# Patient Record
Sex: Male | Born: 1970 | Race: Black or African American | Hispanic: No | Marital: Single | Smoking: Never smoker
Health system: Southern US, Community
[De-identification: ages and names within clinical notes are randomized; demographics above are authoritative.]

## PROBLEM LIST (undated history)

## (undated) DIAGNOSIS — L739 Follicular disorder, unspecified: Secondary | ICD-10-CM

## (undated) HISTORY — DX: Follicular disorder, unspecified: L73.9

---

## 2010-06-26 ENCOUNTER — Emergency Department (HOSPITAL_COMMUNITY): Admission: EM | Admit: 2010-06-26 | Discharge: 2010-06-26 | Payer: Self-pay | Admitting: Emergency Medicine

## 2010-07-16 ENCOUNTER — Ambulatory Visit: Payer: Self-pay | Admitting: Family Medicine

## 2010-07-16 ENCOUNTER — Inpatient Hospital Stay (HOSPITAL_COMMUNITY): Admission: EM | Admit: 2010-07-16 | Discharge: 2010-07-19 | Payer: Self-pay | Admitting: Emergency Medicine

## 2010-07-25 ENCOUNTER — Ambulatory Visit: Payer: Self-pay | Admitting: Family Medicine

## 2010-07-25 DIAGNOSIS — R519 Headache, unspecified: Secondary | ICD-10-CM | POA: Insufficient documentation

## 2010-07-25 DIAGNOSIS — R05 Cough: Secondary | ICD-10-CM

## 2010-07-25 DIAGNOSIS — R51 Headache: Secondary | ICD-10-CM | POA: Insufficient documentation

## 2010-08-22 ENCOUNTER — Emergency Department (HOSPITAL_COMMUNITY): Admission: EM | Admit: 2010-08-22 | Discharge: 2010-08-22 | Payer: Self-pay | Admitting: Emergency Medicine

## 2010-09-04 ENCOUNTER — Ambulatory Visit: Payer: Self-pay | Admitting: Family Medicine

## 2010-09-04 LAB — CONVERTED CEMR LAB
Glucose, Urine, Semiquant: NEGATIVE
Protein, U semiquant: NEGATIVE
Specific Gravity, Urine: 1.02
Urobilinogen, UA: 0.2
WBC Urine, dipstick: NEGATIVE
pH: 6

## 2010-09-05 ENCOUNTER — Encounter: Payer: Self-pay | Admitting: Family Medicine

## 2010-09-05 ENCOUNTER — Telehealth: Payer: Self-pay | Admitting: Family Medicine

## 2010-09-24 ENCOUNTER — Emergency Department (HOSPITAL_COMMUNITY)
Admission: EM | Admit: 2010-09-24 | Discharge: 2010-09-24 | Payer: Self-pay | Source: Home / Self Care | Admitting: Emergency Medicine

## 2010-10-08 ENCOUNTER — Ambulatory Visit: Admission: RE | Admit: 2010-10-08 | Discharge: 2010-10-08 | Payer: Self-pay | Source: Home / Self Care

## 2010-10-08 DIAGNOSIS — S139XXA Sprain of joints and ligaments of unspecified parts of neck, initial encounter: Secondary | ICD-10-CM | POA: Insufficient documentation

## 2010-10-23 ENCOUNTER — Emergency Department (HOSPITAL_COMMUNITY)
Admission: EM | Admit: 2010-10-23 | Discharge: 2010-10-23 | Payer: Self-pay | Source: Home / Self Care | Admitting: Emergency Medicine

## 2010-10-30 ENCOUNTER — Emergency Department (HOSPITAL_COMMUNITY)
Admission: EM | Admit: 2010-10-30 | Discharge: 2010-10-30 | Payer: Self-pay | Source: Home / Self Care | Admitting: Emergency Medicine

## 2010-10-31 ENCOUNTER — Ambulatory Visit
Admission: RE | Admit: 2010-10-31 | Discharge: 2010-10-31 | Payer: Self-pay | Source: Home / Self Care | Attending: Family Medicine | Admitting: Family Medicine

## 2010-10-31 ENCOUNTER — Encounter: Payer: Self-pay | Admitting: Family Medicine

## 2010-10-31 DIAGNOSIS — L299 Pruritus, unspecified: Secondary | ICD-10-CM | POA: Insufficient documentation

## 2010-10-31 DIAGNOSIS — R300 Dysuria: Secondary | ICD-10-CM | POA: Insufficient documentation

## 2010-10-31 LAB — CONVERTED CEMR LAB
Albumin: 4.9 g/dL (ref 3.5–5.2)
Bilirubin Urine: NEGATIVE
CO2: 24 meq/L (ref 19–32)
Calcium: 10.1 mg/dL (ref 8.4–10.5)
Glucose, Bld: 90 mg/dL (ref 70–99)
Ketones, urine, test strip: NEGATIVE
Potassium: 3.7 meq/L (ref 3.5–5.3)
Protein, U semiquant: NEGATIVE
RBC: 5.23 M/uL (ref 4.22–5.81)
Sodium: 138 meq/L (ref 135–145)
TSH: 1.257 microintl units/mL (ref 0.350–4.500)
Total Protein: 7.9 g/dL (ref 6.0–8.3)
Urobilinogen, UA: 0.2
WBC: 6.8 10*3/uL (ref 4.0–10.5)

## 2010-11-04 ENCOUNTER — Encounter: Payer: Self-pay | Admitting: Family Medicine

## 2010-11-04 NOTE — Assessment & Plan Note (Signed)
Summary: stomach pain/diarrhea.df   Vital Signs:  Patient profile:   40 year old male Weight:      184 pounds Temp:     98.5 degrees F oral BP sitting:   112 / 82  (left arm) Cuff size:   large  Vitals Entered By: Tessie Fass CMA (September 04, 2010 12:08 PM) CC: abdominal pain x 2 days Pain Assessment Patient in pain? yes     Location: abdomen Intensity: 3   CC:  abdominal pain x 2 days.  History of Present Illness: Pt seen in acute visit; complaint of gassiness in periumbilical area, also some discomfort in abd, x3 weeks. Initially with diarrhea, which has subsided and finally resolved about 4-5 days ago, now with formed stool (nonbloody).   Patient's wife has been treated for positive syphilis, patient is also taking doxycycline from [facility].  Does not associate GI sxs with food or with the doxy.    Has been to [facility] ER for abd sxs, was diagnosed with gastritis and started on Pepcid.  Improved but not resolved.  Still taking the Pepcid.  Complaint of urethral itching, although denies painful void; has not been sexually active with anyone other than his wife.  Denies urethral discharge or change in appearance of urine, no polyuria.   [place], was in refugee camp in [place].  Is a Runner, broadcasting/film/video by trade, working at the [employer] now.  Lives with wife and children.   Allergies (verified): 1)  ! * Quinolones  Physical Exam  General:  well appearingl, no apparent distress Mouth:  clear oropharynx. Lungs:  Normal respiratory effort, chest expands symmetrically. Lungs are clear to auscultation, no crackles or wheezes. Heart:  Normal rate and regular rhythm. S1 and S2 normal without gallop, murmur, click, rub or other extra sounds. Abdomen:  Soft, nontender, nondistended.  No masses.  Normal bowel sounds heard.  Mild discomfort when I palpate suprapubic area.  Genitalia:  No inguinal adenopathy. No urethral meatal redness.  No genital lesions noted  on inspection   Impression & Recommendations:  Problem # 1:  ACUTE GASTRITIS WITHOUT MENTION OF HEMORRHAGE (ICD-535.00) Patient with abd sxs which appear to be improving.  To switch to PPI instead of H1blocker. May consider H pylori testing at next visit.  His updated medication list for this problem includes:    Omeprazole 20 Mg Cpdr (Omeprazole) ..... Sig: take 1 tab by mouth one time daily  Orders: [facility]- Est  Level 4 (86578)  Problem # 2:  DYSURIA (ICD-788.1) UA not remarkable.  Sending GC/Chlamydia PCR to check for STDs, given exposure to syphilis.  Will contact with results when available.  Orders: [facility]- Est  Level 4 (46962) Miscellaneous Lab Charge-[facility] (95284) Urinalysis-[facility] (00000)  His updated medication list for this problem includes:    Doxycycline Hyclate 100 Mg Caps (Doxycycline hyclate) .Marland Kitchen... From [facility]  Complete Medication List: 1)  Omeprazole 20 Mg Cpdr (Omeprazole) .... Sig: take 1 tab by mouth one time daily 2)  Doxycycline Hyclate 100 Mg Caps (Doxycycline hyclate) .... From [facility]  Patient Instructions: 1)  It was a pleasure to see you today.  I have sent a prescription for a new medication for your stomach to [facility] on [address]. [employer]. 2)  You may stop taking the Pepcid you got at the ER at [facility] on November 18th. 3)  I am sending a urinalysis to look for possible causes of your  urethral itching.  4)  I would like you to make a followup appointment here in the coming 2 weeks.  Prescriptions: OMEPRAZOLE 20 MG CPDR (OMEPRAZOLE) SIG: take 1 tab by mouth one time daily  #30 x 2   Entered and Authorized by:   Paula Compton MD   Signed by:   Paula Compton MD on 09/04/2010   Method used:   Electronically to        [facility] [id number] (retail)       [address]       [place], [place]  [address]       Ph: [phone]       Fax: [phone]   RxID:   [id number]    Orders Added: 1)   [facility]- Est  Level 4 [99214] 2)  Miscellaneous Lab Charge-[facility] [88416] 3)  Urinalysis-[facility] [00000]    Laboratory Results   Urine Tests  Date/Time Received: September 04, 2010 12:30 PM  Date/Time Reported: September 04, 2010 1:48 PM   Routine Urinalysis   Color: yellow Appearance: Clear Glucose: negative   (Normal Range: Negative) Bilirubin: negative   (Normal Range: Negative) Ketone: negative   (Normal Range: Negative) Spec. Gravity: 1.020   (Normal Range: 1.003-1.035) Blood: negative   (Normal Range: Negative) pH: 6.0   (Normal Range: 5.0-8.0) Protein: negative   (Normal Range: Negative) Urobilinogen: 0.2   (Normal Range: 0-1) Nitrite: negative   (Normal Range: Negative) Leukocyte Esterace: negative   (Normal Range: Negative)    Comments: ...........test performed by...........Marland KitchenTerese Door, CMA
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
## 2010-11-04 NOTE — Letter (Signed)
Summary: Generic Letter  [facility]  [address]   [place], [place] [address]   Phone: [phone]  Fax: [phone]    09/05/2010  Darrell Donovan [address] [place], [place]  [address]  Dear [name],   I hope this letter finds you well.  I write with good news.  Your urine studies in our office are normal.  Please call back with any questions.       Sincerely,   Paula Compton MD  Appended Document: Generic Letter mailed

## 2010-11-04 NOTE — Progress Notes (Signed)
  Phone Note Outgoing Call Call back at [facility] Phone [phone]   Summary of Call: Called patient to let him know that urine studies are normal.  Left voice message, will send letter.  Initial call taken by: Paula Compton MD,  September 05, 2010 2:08 PM
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
## 2010-11-06 NOTE — Assessment & Plan Note (Signed)
Summary: stomach & back pain/eo   Vital Signs:  Patient profile:   40 year old male Weight:      187.3 pounds Temp:     98.3 degrees F oral Pulse rate:   67 / minute BP sitting:   116 / 86  (left arm) Cuff size:   regular  Vitals Entered By: Garen Grams LPN (October 31, 2010 2:30 PM) CC: burning / itching Is Patient Diabetic? No Pain Assessment Patient in pain? no        Primary Care Provider:  Jamie Brookes MD  CC:  burning / itching.  History of Present Illness: 1. Burning / Itching:  Pt complains of this strange sensation that jumps around.  He has was he describes as a burning / itching sensation that is focal and goes from his arms, to his legs, to his abdomen, and to his groin.  He has had it for a couple of weeks.  Nothing makes it better.  Nothing makes it worse.  He hasn't taken anything for it.  ROS: denies abdominal pain, chest pain, skin discoloration, constipation  2. Dysuria:  Had one episode of dysuria about 1 week ago.  Now the burning has stopped but it still takes him a little longer to get a normal flow of urine.  ROS: denies hematuria, fevers, chills  Habits & Providers  Alcohol-Tobacco-Diet     Tobacco Status: never  Current Medications (verified): 1)  Omeprazole 20 Mg Cpdr (Omeprazole) .... Sig: Take 1 Tab By Mouth One Time Daily 2)  Cyclobenzaprine Hcl 10 Mg Tabs (Cyclobenzaprine Hcl) .... Take 1 Tablet At Night To Help The Muscle Spasms and The Pain 3)  Ibuprofen 800 Mg Tabs (Ibuprofen) .... Take 1 Pill Every 8 Hours As Need For Pain During The Day. 4)  Hydroxyzine Hcl 10 Mg Tabs (Hydroxyzine Hcl) .Marland Kitchen.. 1 Tab By Mouth Daily For Itching  Allergies: 1)  ! * Quinolones  Past History:  Past Medical History: Reviewed history from 07/25/2010 and no changes required. malaria in 1990's, treated  Social History: Reviewed history from 10/08/2010 and no changes required. 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 no smoking, alcohol and no drugs  Physical Exam  General:  Vitals reviewed.  Well appearing.  Comfortable.  No acute distress Head:  normocephalic and atraumatic.   Eyes:  Normal conjunctiva.  No scleral icterus Mouth:  clear oropharynx. Lungs:  Normal respiratory effort, chest expands symmetrically. Lungs are clear to auscultation, no crackles or wheezes. Heart:  Normal rate and regular rhythm. S1 and S2 normal without gallop, murmur, click, rub or other extra sounds. Abdomen:  soft, non-tender, normal bowel sounds, no distention, no masses, no guarding, and no rigidity.   Msk:  no joint tenderness, no joint swelling, and no joint warmth.   Extremities:  No clubbing, cyanosis, edema, or deformity noted with normal full range of motion of all joints.   Neurologic:  Intact Skin:  No skin discoloration.  No rashes or suspicious skin lesions Psych:  not anxious appearing and not depressed appearing.     Impression & Recommendations:  Problem # 1:  PRURITUS (ICD-698.9) Assessment New Not sure what is causing this.  Nonspecific description of burning / itching of various parts of his body that comes and goes and is focal.  Will get some basic labs.  Treat the  itching with Hydroxyzine. Orders: Comp Met-[facility] [id number]) CBC-[facility] (09811) TSH-[facility] [id number]) [facility]- Est  Level 4 (13086)  Problem # 2:  DYSURIA (ICD-788.1) Assessment: New Dysuria has largely resolved but still complaining of some urinary hesistancy.  Will check UA. Orders: Urinalysis-[facility] (00000) [facility]- Est  Level 4 (57846)  Complete Medication List: 1)  Omeprazole 20 Mg Cpdr (Omeprazole) .... Sig: take 1 tab by mouth one time daily 2)  Cyclobenzaprine Hcl 10 Mg Tabs (Cyclobenzaprine hcl) .... Take 1 tablet at night to help the muscle spasms and the pain 3)  Ibuprofen 800 Mg Tabs (Ibuprofen) .... Take 1 pill  every 8 hours as need for pain during the day. 4)  Hydroxyzine Hcl 10 Mg Tabs (Hydroxyzine hcl) .Marland Kitchen.. 1 tab by mouth daily for itching  Patient Instructions: 1)  I am not sure what could be causing your strange burning and itching 2)  I am going to get some labs to try and figure it out 3)  In the meantime, try hydroxyzine, and see if that helps 4)  Please schedule a follow up appointment with your regular doctor in 6-8 weeks Prescriptions: HYDROXYZINE HCL 10 MG TABS (HYDROXYZINE HCL) 1 tab by mouth daily for itching  #30 x 3   Entered and Authorized by:   Angelena Sole MD   Signed by:   Angelena Sole MD on 10/31/2010   Method used:   Electronically to        [facility] [id number] (retail)       [address] [place], [place]  [address]       Ph: [phone]       Fax: [phone]   RxID:   [id number]    Orders Added: 1)  Urinalysis-[facility] [00000] 2)  Comp Met-[facility] [[id number]] 3)  CBC-[facility] [85027] 4)  TSH-[facility] [[id number]] 5)  [facility]- Est  Level 4 [09323]    Laboratory Results   Urine Tests  Date/Time Received: October 31, 2010 2:41 PM  Date/Time Reported: October 31, 2010 3:04 PM   Routine Urinalysis   Color: yellow Appearance: Clear Glucose: negative   (Normal Range: Negative) Bilirubin: negative   (Normal Range: Negative) Ketone: negative   (Normal Range: Negative) Spec. Gravity: <1.005   (Normal Range: 1.003-1.035) Blood: negative   (Normal Range: Negative) pH: 5.5   (Normal Range: 5.0-8.0) Protein: negative   (Normal Range: Negative) Urobilinogen: 0.2   (Normal Range: 0-1) Nitrite: negative   (Normal Range: Negative) Leukocyte Esterace: negative   (Normal Range: Negative)    Comments: .........Marland Kitchenbiochemical negative; microscopic not indicated ...............test performed by......Marland KitchenBonnie A. [place], MLS (ASCP)cm

## 2010-11-10 ENCOUNTER — Encounter: Payer: Self-pay | Admitting: Family Medicine

## 2010-11-12 NOTE — Miscellaneous (Signed)
Summary: ROI  ROI   Imported By: De Nurse 11/04/2010 19:20:51  _____________________________________________________________________  External Attachment:    Type:   Image     Comment:   External Document

## 2010-11-20 NOTE — Miscellaneous (Signed)
Summary: ROI  ROI   Imported By: De Nurse 11/11/2010 11:30:01  _____________________________________________________________________  External Attachment:    Type:   Image     Comment:   External Document

## 2010-11-26 ENCOUNTER — Telehealth: Payer: Self-pay | Admitting: Family Medicine

## 2010-11-26 NOTE — Telephone Encounter (Signed)
Informed of lab results being normal..Darrell Donovan, Darrell Donovan

## 2010-11-26 NOTE — Telephone Encounter (Signed)
Please let this patient know that his labs were all normal from 10-31-10. His hemoglobin, eletrolytes and thyroid were all normal. [place].

## 2010-11-26 NOTE — Telephone Encounter (Signed)
Pt asking for test results from last visit in Jan.

## 2010-11-26 NOTE — Telephone Encounter (Signed)
Pt is requesting results of labs from January.Loralee Pacas [place]

## 2010-12-16 LAB — DIFFERENTIAL
Eosinophils Relative: 2 % (ref 0–5)
Lymphocytes Relative: 39 % (ref 12–46)
Lymphs Abs: 1.9 10*3/uL (ref 0.7–4.0)

## 2010-12-16 LAB — CBC
MCV: 86.3 fL (ref 78.0–100.0)
Platelets: 210 10*3/uL (ref 150–400)
RBC: 4.82 MIL/uL (ref 4.22–5.81)
WBC: 4.9 10*3/uL (ref 4.0–10.5)

## 2010-12-16 LAB — URINALYSIS, ROUTINE W REFLEX MICROSCOPIC
Glucose, UA: NEGATIVE mg/dL
Ketones, ur: NEGATIVE mg/dL
Specific Gravity, Urine: 1.014 (ref 1.005–1.030)
pH: 5.5 (ref 5.0–8.0)

## 2010-12-16 LAB — BASIC METABOLIC PANEL
Chloride: 106 mEq/L (ref 96–112)
Creatinine, Ser: 1.25 mg/dL (ref 0.4–1.5)
GFR calc Af Amer: >60 mL/min (ref >60)
Potassium: 3.9 mEq/L (ref 3.5–5.1)

## 2010-12-17 LAB — EXPECTORATED SPUTUM ASSESSMENT W GRAM STAIN, RFLX TO RESP C

## 2010-12-18 LAB — AFB CULTURE WITH SMEAR (NOT AT ARMC)
Acid Fast Smear: NONE SEEN
Acid Fast Smear: NONE SEEN

## 2010-12-18 LAB — CULTURE, RESPIRATORY W GRAM STAIN: Culture: NORMAL

## 2010-12-18 LAB — DIFFERENTIAL
Basophils Relative: 0 % (ref 0–1)
Basophils Relative: 0 % (ref 0–1)
Eosinophils Absolute: 0.2 10*3/uL (ref 0.0–0.7)
Eosinophils Relative: 1 % (ref 0–5)
Lymphs Abs: 1.8 10*3/uL (ref 0.7–4.0)
Monocytes Absolute: 1.1 10*3/uL — ABNORMAL HIGH (ref 0.1–1.0)
Monocytes Absolute: 0.9 10*3/uL (ref 0.1–1.0)
Monocytes Relative: 16 % — ABNORMAL HIGH (ref 3–12)
Monocytes Relative: 12 % (ref 3–12)
Neutro Abs: 3.7 10*3/uL (ref 1.7–7.7)

## 2010-12-18 LAB — CBC
HCT: 40.3 % (ref 39.0–52.0)
HCT: 43 % (ref 39.0–52.0)
Hemoglobin: 14 g/dL (ref 13.0–17.0)
Hemoglobin: 15 g/dL (ref 13.0–17.0)
MCH: 28.7 pg (ref 26.0–34.0)
MCH: 29.5 pg (ref 26.0–34.0)
MCHC: 34.7 g/dL (ref 30.0–36.0)
MCHC: 34.9 g/dL (ref 30.0–36.0)
MCV: 84.6 fL (ref 78.0–100.0)
RBC: 4.87 MIL/uL (ref 4.22–5.81)

## 2010-12-18 LAB — BASIC METABOLIC PANEL
CO2: 29 mEq/L (ref 19–32)
Chloride: 102 mEq/L (ref 96–112)
Glucose, Bld: 94 mg/dL (ref 70–99)
Potassium: 3.7 mEq/L (ref 3.5–5.1)
Sodium: 138 mEq/L (ref 135–145)

## 2010-12-18 LAB — POCT I-STAT, CHEM 8
BUN: 20 mg/dL (ref 6–23)
Creatinine, Ser: 1.2 mg/dL (ref 0.4–1.5)
Sodium: 139 mEq/L (ref 135–145)
TCO2: 28 mmol/L (ref 0–100)

## 2010-12-18 LAB — QUANTIFERON TB GOLD ASSAY (BLOOD)
Interferon Gamma Release Assay: NEGATIVE
Mitogen Minus Nil Value: 6.94 IU/mL

## 2010-12-18 LAB — EXPECTORATED SPUTUM ASSESSMENT W GRAM STAIN, RFLX TO RESP C

## 2010-12-31 ENCOUNTER — Encounter: Payer: Self-pay | Admitting: Family Medicine

## 2010-12-31 ENCOUNTER — Ambulatory Visit (INDEPENDENT_AMBULATORY_CARE_PROVIDER_SITE_OTHER): Payer: Self-pay | Admitting: Family Medicine

## 2010-12-31 DIAGNOSIS — R197 Diarrhea, unspecified: Secondary | ICD-10-CM | POA: Insufficient documentation

## 2010-12-31 DIAGNOSIS — S139XXA Sprain of joints and ligaments of unspecified parts of neck, initial encounter: Secondary | ICD-10-CM

## 2010-12-31 DIAGNOSIS — R142 Eructation: Secondary | ICD-10-CM

## 2010-12-31 DIAGNOSIS — K5900 Constipation, unspecified: Secondary | ICD-10-CM | POA: Insufficient documentation

## 2010-12-31 DIAGNOSIS — L299 Pruritus, unspecified: Secondary | ICD-10-CM

## 2010-12-31 DIAGNOSIS — R141 Gas pain: Secondary | ICD-10-CM | POA: Insufficient documentation

## 2010-12-31 MED ORDER — CETIRIZINE HCL 10 MG PO TABS: 10.0000 mg | ORAL_TABLET | Freq: Every day | ORAL | Status: DC

## 2010-12-31 MED ORDER — HYDROXYZINE HCL 10 MG PO TABS: 10.0000 mg | ORAL_TABLET | Freq: Every day | ORAL | Status: DC

## 2010-12-31 MED ORDER — IBUPROFEN 800 MG PO TABS: 800.0000 mg | ORAL_TABLET | Freq: Three times a day (TID) | ORAL | Status: DC | PRN

## 2010-12-31 NOTE — Patient Instructions (Addendum)
Go to the pharamacy and get  immudium for diarrhea, Miralax for constipation, and make sure to eat a diet with fruits and vegetables to increase the amount of fiber in your diet.  You can use Gas-X for the pain of "bubbles" in your intestines.  Take the cetirizine for allergies and itching every morning. Only take the hydroxyzine if you have really bad itching after a shower at night because it will make you sleepy.  Take the Motrin 800 mg WITH FOOD, for the pain in your neck and low back.  You can also try massage to those muscles or heat (heating pad or hot towels) over the muscles to help them relax.

## 2010-12-31 NOTE — Assessment & Plan Note (Signed)
Pt has occasional diarrhea, will recommend Immodium OTC.

## 2011-01-01 NOTE — Assessment & Plan Note (Addendum)
Paraspinal muscles are sore. No injury suspected. Suggested Motrin 800 mg every 8 hours and/or heating pad/towels around his neck and massage if possible.
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
## 2011-01-01 NOTE — Assessment & Plan Note (Addendum)
Pt complains of intermittat constipation. Suggested using Miralax for constipation. Can buy OTC

## 2011-01-01 NOTE — Assessment & Plan Note (Signed)
Pt c/o itching that is most intense after getting out of the shower. Plan to start him on Zyrtec and hydroxyzine for the really bad itching.

## 2011-01-01 NOTE — Assessment & Plan Note (Signed)
Pt complains of "bubbles" in his stomach and pain with the air moving through his stomach.  Recommended getting GasX for the pain.

## 2011-06-05 ENCOUNTER — Emergency Department (HOSPITAL_COMMUNITY)
Admission: EM | Admit: 2011-06-05 | Discharge: 2011-06-05 | Discharge status: Against Medical Advice | Payer: Medicaid Other | Attending: Emergency Medicine | Admitting: Emergency Medicine

## 2011-06-05 DIAGNOSIS — Z0389 Encounter for observation for other suspected diseases and conditions ruled out: Secondary | ICD-10-CM | POA: Insufficient documentation

## 2011-07-01 ENCOUNTER — Ambulatory Visit: Payer: Medicaid Other

## 2011-07-02 ENCOUNTER — Ambulatory Visit (INDEPENDENT_AMBULATORY_CARE_PROVIDER_SITE_OTHER): Payer: Medicaid Other | Admitting: Family Medicine

## 2011-07-02 DIAGNOSIS — R300 Dysuria: Secondary | ICD-10-CM

## 2011-07-02 DIAGNOSIS — R109 Unspecified abdominal pain: Secondary | ICD-10-CM | POA: Insufficient documentation

## 2011-07-02 LAB — CBC WITH DIFFERENTIAL/PLATELET
Basophils Relative: 0 % (ref 0–1)
Eosinophils Absolute: 0.1 10*3/uL (ref 0.0–0.7)
MCH: 28.9 pg (ref 26.0–34.0)
MCHC: 33.8 g/dL (ref 30.0–36.0)
Neutrophils Relative %: 50 % (ref 43–77)
Platelets: 238 10*3/uL (ref 150–400)

## 2011-07-02 LAB — POCT URINALYSIS DIPSTICK
Glucose, UA: NEGATIVE
Ketones, UA: NEGATIVE
Spec Grav, UA: 1.025

## 2011-07-02 MED ORDER — CEPHALEXIN 500 MG PO CAPS: 500.0000 mg | ORAL_CAPSULE | Freq: Four times a day (QID) | ORAL | Status: DC

## 2011-07-02 MED ORDER — DOXYCYCLINE HYCLATE 100 MG PO TABS: 100.0000 mg | ORAL_TABLET | Freq: Two times a day (BID) | ORAL | Status: AC

## 2011-07-02 NOTE — Progress Notes (Signed)
  Subjective:    Patient ID: Darrell Donovan, male    DOB: 1970/10/10, 40 y.o.   MRN: [id number]  HPI Dysuria x 1 week. + abdominal pain, increased urinary frequency. No penile discharge however increase penile irritation. Patient states he's in a monogamous relationship with his wife. No other sexual activity.  Patient does state that his wife was diagnosed with syphilis about 6 months ago and treated.   Review of Systems See HPI    Objective:   Physical Exam Gen: up in chair, NAD HEENT: NCAT, EOMI, TMs clear bilaterally CV: RRR, no murmurs auscultated PULM: CTAB, no wheezes, rales, rhoncii ABD: S/+ bowel sounds/+ mild suprapubic tenderness EXT: 2+ peripheral pulses   Assessment & Plan:

## 2011-07-02 NOTE — Patient Instructions (Signed)
It was very good to see you today  I am treating you with antibiotics  We will culture your urine I will call you if any results are positive Follow up with your regular doctor, Call with any questions,  God Bless,  Doree Albee MD

## 2011-07-03 LAB — RPR

## 2011-07-03 LAB — HIV ANTIBODY (ROUTINE TESTING W REFLEX): HIV: NONREACTIVE

## 2011-07-03 LAB — GC/CHLAMYDIA PROBE AMP, URINE: GC Probe Amp, Urine: NEGATIVE

## 2011-07-03 NOTE — Assessment & Plan Note (Signed)
Will symptomatically cover for urethritis with doxycycline. Broad-spectrum cultures obtained including HIV, urine culture,  RPR, urine GC/chlamydia. Red flags for return discussed.

## 2011-07-04 LAB — URINE CULTURE: Colony Count: NO GROWTH

## 2011-07-07 ENCOUNTER — Telehealth: Payer: Self-pay | Admitting: Family Medicine

## 2011-07-07 NOTE — Telephone Encounter (Signed)
Darrell Donovan was prescribed Doxycycline on 9/27 and it was sent to a Pharmacy in [place], but he needs it sent to [facility].  Patient will call back with the name of the Pharmacy he wants the medication to go to.

## 2011-07-07 NOTE — Telephone Encounter (Signed)
Darrell Donovan, I attempted to call this patient and find out what pharmacy he wants to switch to. He speaks broken [place], would you please call him and confirm the pharmacy he wants meds sent to. Also if you would please inform patient that he can callthe pharmacy he is using now and transfer it to new pharmacy by informing them. ----Huntley Dec

## 2011-07-09 NOTE — Telephone Encounter (Signed)
Patient called today and left a message on the Physicians/ Pharmacy line .  Difficult to understand message but it sounds like pharmacy does not have a med that was suppose to be sent in.Tried to call him back. Message left for him to call us back.

## 2011-07-23 ENCOUNTER — Ambulatory Visit (INDEPENDENT_AMBULATORY_CARE_PROVIDER_SITE_OTHER): Payer: Medicaid Other | Admitting: Family Medicine

## 2011-07-23 ENCOUNTER — Ambulatory Visit: Payer: Medicaid Other | Admitting: Family Medicine

## 2011-07-23 VITALS — BP 118/71 | HR 78 | Temp 98.2°F | Ht 68.0 in | Wt 183.0 lb

## 2011-07-23 DIAGNOSIS — R21 Rash and other nonspecific skin eruption: Secondary | ICD-10-CM

## 2011-07-23 DIAGNOSIS — L299 Pruritus, unspecified: Secondary | ICD-10-CM

## 2011-07-23 MED ORDER — HYDROCORTISONE 1 % EX LOTN: TOPICAL_LOTION | Freq: Two times a day (BID) | CUTANEOUS | Status: DC

## 2011-07-23 MED ORDER — HYDROXYZINE HCL 10 MG PO TABS: 10.0000 mg | ORAL_TABLET | Freq: Every day | ORAL | Status: AC

## 2011-07-23 NOTE — Patient Instructions (Signed)
Use the Hydrocortisone cream on your itchy spots for the next 2 weeks.  If the itching or rash get worse, come back and see [place]. You should be rechecked in 2-3 months (December or January) to make sure that your blood tests are still negative. If you have any questions, call and let me know. If the rash has not gone away, come back and see me in about 3-4 weeks.

## 2011-07-23 NOTE — Progress Notes (Signed)
  Subjective:    Patient ID: Darrell Donovan, male    DOB: August 31, 1971, 40 y.o.   MRN: [id number]  HPI 1.  Rash:  Patient complains of bumps and itching for the past 2 weeks. Of note he was recently seen at the end of September as his wife was diagnosed with syphilis. RPR and HIV for him were negative. Has not tried anything for relief of itching. No one else in his family at home has a rash or itching. No fevers or chills. No recent illnesses.   Review of Systems See HPI above for review of systems.       Objective:   Physical Exam Gen:  Alert, cooperative patient who appears stated age in no acute distress.  Vital signs reviewed. Skin:  Few scattered, pinpoint papules along medial aspect of thigh.  No signs of folliculitis.  Few scattered papules across back.  No macular lesions, plaques, other lesions throughout body.  No penile chancre.  Scars noted across back of hands, back, legs -- patient states these are old from when he was in [place].  Lymph:  No lymphadenopathy        Assessment & Plan:

## 2011-07-23 NOTE — Assessment & Plan Note (Signed)
Unclear etiology of rash.  Plan for symptomatic treatment. Refilled hydroxyzine.   All micro labs have been negative thus far. Low dose hydrocortisone for relief.   No signs of systemic illness, no signs of maculopapular rash or evidence of secondary/primary syphilis. Recommended fu in 3 months for repeat RPR/HIV.   FU 2-4 weeks if not improving or worsening.

## 2011-08-19 ENCOUNTER — Ambulatory Visit: Payer: Medicaid Other | Admitting: Family Medicine

## 2011-09-02 ENCOUNTER — Encounter: Payer: Self-pay | Admitting: Family Medicine

## 2011-09-02 ENCOUNTER — Ambulatory Visit (INDEPENDENT_AMBULATORY_CARE_PROVIDER_SITE_OTHER): Payer: Medicaid Other | Admitting: Family Medicine

## 2011-09-02 VITALS — BP 115/84 | HR 67 | Temp 97.3°F | Ht 68.0 in | Wt 191.0 lb

## 2011-09-02 DIAGNOSIS — L299 Pruritus, unspecified: Secondary | ICD-10-CM

## 2011-09-02 MED ORDER — EUCERIN EX CREA: TOPICAL_CREAM | CUTANEOUS | Status: DC | PRN

## 2011-09-02 MED ORDER — CLOTRIMAZOLE-BETAMETHASONE 1-0.05 % EX CREA: TOPICAL_CREAM | CUTANEOUS | Status: DC

## 2011-09-02 NOTE — Patient Instructions (Signed)

## 2011-09-02 NOTE — Progress Notes (Signed)
  Subjective:     Darrell Donovan is a 40 y.o. male who presents for evaluation of a rash involving the lower extremity, thigh and feet. Rash started 1 month ago. Lesions are not visible, and pruritic in texture.Associated symptoms: none. Patient denies: abdominal pain, arthralgia, cough, fever and sore throat. Patient has not had contacts with similar rash. Patient has had new exposures (soaps, lotions, laundry detergents, foods, medications, plants, insects or animals). He has been in sexual contact with his wife who has a history of treated syphilis. He has two RPR's in the last month that are negative.  He also has breakdown of web spaces between toes. Without itching.  Review of Systems Pertinent items are noted in HPI.    Objective:    BP 115/84  Pulse 67  Temp(Src) 97.3 F (36.3 C) (Oral)  Ht 5\' 8"  (1.727 m)  Wt 191 lb (86.637 kg)  BMI 29.04 kg/m2 General:  alert and cooperative  Skin:  no rash or abnormalities    Toes: breakdown and maceration of web spaces between toes Assessment:    Dry Skin and pruritis    Tinea pedis   Plan:    Medications: eucerin cream.  Lotrimin cream for toes.

## 2011-09-02 NOTE — Assessment & Plan Note (Signed)
Secondary to dry skin. Likely from weather change and detergents.  He has recently come to this climate from [place].

## 2011-10-02 ENCOUNTER — Emergency Department (HOSPITAL_COMMUNITY)
Admission: EM | Admit: 2011-10-02 | Discharge: 2011-10-02 | Disposition: A | Discharge status: Home / Self Care | Payer: Medicaid Other | Attending: Emergency Medicine | Admitting: Emergency Medicine

## 2011-10-02 ENCOUNTER — Emergency Department (HOSPITAL_COMMUNITY): Payer: Medicaid Other

## 2011-10-02 ENCOUNTER — Encounter (HOSPITAL_COMMUNITY): Payer: Self-pay

## 2011-10-02 ENCOUNTER — Other Ambulatory Visit: Payer: Self-pay

## 2011-10-02 DIAGNOSIS — M79609 Pain in unspecified limb: Secondary | ICD-10-CM | POA: Insufficient documentation

## 2011-10-02 DIAGNOSIS — R071 Chest pain on breathing: Secondary | ICD-10-CM | POA: Insufficient documentation

## 2011-10-02 DIAGNOSIS — R0789 Other chest pain: Secondary | ICD-10-CM

## 2011-10-02 LAB — TROPONIN I: Troponin I: <0.3 ng/mL (ref <0.30)

## 2011-10-02 MED ORDER — IBUPROFEN 600 MG PO TABS: 600.0000 mg | ORAL_TABLET | Freq: Four times a day (QID) | ORAL | Status: AC | PRN

## 2011-10-02 MED ORDER — HYDROCODONE-ACETAMINOPHEN 5-325 MG PO TABS: 2.0000 | ORAL_TABLET | ORAL | Status: AC | PRN

## 2011-10-02 NOTE — ED Provider Notes (Signed)
History     CSN: [id number]  Arrival date & time 10/02/11  1446   First MD Initiated Contact with Patient 10/02/11 1810      Chief Complaint  Patient presents with  . Chest Pain    (Consider location/radiation/quality/duration/timing/severity/associated sxs/prior treatment) HPI Rt. Side chest pain began under his rt. Arm and radiates into his anterior rt. Chest wall. Pt. Denies any injuries, does have intermittent sob. Denies any n/v History reviewed. No pertinent past medical history.  History reviewed. No pertinent past surgical history.  History reviewed. No pertinent family history.  History  Substance Use Topics  . Smoking status: Never Smoker   . Smokeless tobacco: Not on file  . Alcohol Use: No      Review of Systems  Allergies  Quinolones  Home Medications   Current Outpatient Rx  Name Route Sig Dispense Refill  . HYDROCODONE-ACETAMINOPHEN 5-325 MG PO TABS Oral Take 2 tablets by mouth every 4 (four) hours as needed for pain. 15 tablet 0  . IBUPROFEN 600 MG PO TABS Oral Take 1 tablet (600 mg total) by mouth every 6 (six) hours as needed for pain. 30 tablet 0    BP 107/74  Pulse 67  Temp(Src) 97.7 F (36.5 C) (Oral)  Resp 16  Ht 5\' 8"  (1.727 m)  Wt 180 lb (81.647 kg)  BMI 27.37 kg/m2  SpO2 100%  Physical Exam  Nursing note and vitals reviewed. Constitutional: He is oriented to person, place, and time. He appears well-developed and well-nourished. No distress.  HENT:  Head: Normocephalic and atraumatic.  Eyes: Pupils are equal, round, and reactive to light.  Neck: Normal range of motion.  Cardiovascular: Normal rate and intact distal pulses.   Pulmonary/Chest: No respiratory distress. He exhibits tenderness.         Tenderness to palpation reproduces the patient's pain  Abdominal: Normal appearance. He exhibits no distension.  Musculoskeletal: Normal range of motion.  Neurological: He is alert and oriented to person, place, and time. No  cranial nerve deficit.  Skin: Skin is warm and dry. No rash noted.  Psychiatric: He has a normal mood and affect. His behavior is normal.    ED Course  Procedures (including critical care time)   Labs Reviewed  TROPONIN I   Dg Chest 2 View  10/02/2011  *RADIOLOGY REPORT*  Clinical Data: Right-sided chest pain  CHEST - 2 VIEW  Comparison: 10/30/2010  Findings: Cardiomediastinal silhouette is stable.  No acute infiltrate or pleural effusion.  No pulmonary edema.  Bony thorax is stable.  IMPRESSION: No active disease.  No significant change.  Original Report Authenticated By: Natasha Mead, M.D.     1. Chest wall pain       MDM          Nelia Shi, MD 10/02/11 2043

## 2011-10-02 NOTE — ED Notes (Signed)
Rt. Side chest pain began under his rt. Arm and radiates into his anterior rt. Chest wall.  Pt. Denies any injuries, does have intermittent sob. Denies any n/v

## 2011-10-20 ENCOUNTER — Ambulatory Visit (INDEPENDENT_AMBULATORY_CARE_PROVIDER_SITE_OTHER): Payer: Medicaid Other | Admitting: Family Medicine

## 2011-10-20 ENCOUNTER — Encounter: Payer: Self-pay | Admitting: Family Medicine

## 2011-10-20 VITALS — BP 117/83 | HR 66 | Temp 98.1°F | Ht 68.0 in | Wt 187.0 lb

## 2011-10-20 DIAGNOSIS — R0781 Pleurodynia: Secondary | ICD-10-CM | POA: Insufficient documentation

## 2011-10-20 DIAGNOSIS — L709 Acne, unspecified: Secondary | ICD-10-CM

## 2011-10-20 DIAGNOSIS — R079 Chest pain, unspecified: Secondary | ICD-10-CM

## 2011-10-20 DIAGNOSIS — L708 Other acne: Secondary | ICD-10-CM

## 2011-10-20 MED ORDER — DICLOFENAC SODIUM 1 % TD GEL: 1.0000 "application " | Freq: Four times a day (QID) | TRANSDERMAL | Status: DC

## 2011-10-20 MED ORDER — SALICYLIC ACID 17 % EX SOLN: Freq: Every day | CUTANEOUS | Status: DC

## 2011-10-20 NOTE — Progress Notes (Signed)
  Subjective:   1. Face rash  Darrell Donovan is a 41 y.o. male who was referred to me for evaluation and treatment of acne. Symptoms have been ongoing for about 2 weeks. The acne is primarily located on the forehead. Previous treatment has included nothing. Risk factors include: none. Family history of acne: negative.  2. Rib pain Patient reports several episodes of night time rib pain bilaterally. Without rash. No chest pain. No SOB. No recent URI. No exercise relation. No cough.   Review of Systems Pertinent items are noted in HPI. No fever, chills, night sweats, weight loss.    Objective:    Physical Exam Lesion Types:  closed comedones  Lesion Locations:  forehead  Complications:  none  Other Findings:  none     Assessment:

## 2011-10-20 NOTE — Assessment & Plan Note (Signed)
Topical salicylic acid to face at night.

## 2011-10-20 NOTE — Assessment & Plan Note (Signed)
Diclofenac gel. Topically.

## 2011-10-20 NOTE — Patient Instructions (Signed)
It was great to see you today!  Schedule an appointment to see me as needed.  I have prescribed a liquid that I want you to put on your face at night with cotton balls.

## 2011-10-21 ENCOUNTER — Other Ambulatory Visit: Payer: Self-pay | Admitting: Family Medicine

## 2011-10-21 MED ORDER — SALICYLIC ACID 6 % EX GEL: Freq: Every day | CUTANEOUS | Status: DC

## 2011-10-29 ENCOUNTER — Ambulatory Visit (INDEPENDENT_AMBULATORY_CARE_PROVIDER_SITE_OTHER): Payer: Medicaid Other | Admitting: Family Medicine

## 2011-10-29 VITALS — BP 124/73 | HR 67 | Temp 97.8°F | Ht 68.0 in | Wt 184.7 lb

## 2011-10-29 DIAGNOSIS — R109 Unspecified abdominal pain: Secondary | ICD-10-CM

## 2011-10-29 DIAGNOSIS — N342 Other urethritis: Secondary | ICD-10-CM

## 2011-10-29 DIAGNOSIS — R1030 Lower abdominal pain, unspecified: Secondary | ICD-10-CM

## 2011-10-29 LAB — POCT URINALYSIS DIPSTICK
Bilirubin, UA: NEGATIVE
Blood, UA: NEGATIVE
Nitrite, UA: NEGATIVE
Urobilinogen, UA: 0.2
pH, UA: 6

## 2011-10-29 MED ORDER — SULFAMETHOXAZOLE-TMP DS 800-160 MG PO TABS: 1.0000 | ORAL_TABLET | Freq: Two times a day (BID) | ORAL | Status: AC

## 2011-10-29 NOTE — Progress Notes (Signed)
Subjective: The patient is a 41 y.o. year old male who presents today for urethral itching and testicular pain.  Has been present for 10 days now. No discharge.  Last sex was August 2012.  Nothing seems to make worse or better.  Had ex-wife (now separated) dx with syphilous.  Had initial STD tests negative.  Objective:  Filed Vitals:   10/29/11 1459  BP: 124/73  Pulse: 67  Temp: 97.8 F (36.6 C)   Gen: NAD, appropriate weight CV: RRR Resp: CTABL Genital: Normal inguinal region with no adenopathy or tenderness, no testicular tenderness, slight tenderness of right vas.  No lesions on scrotum or penis.  No urethral discharge.  Urethral swab obtained for gc/chl.  Rectal and prostate exam refused. Ext: No edema  Assessment/Plan: ?prostatitis vs mild epididymitis.  Will presumptively treat with 2 weeks bactrim then d/c.  Repeat HIV/RPR.  Please also see individual problems in problem list for problem-specific plans.

## 2011-10-29 NOTE — Patient Instructions (Signed)
We will get your blood tested to make certain you have not developed an infection. I want you to take the antibiotic I have sent in two times daily for 14 days. Come back at that time if you aren't feeling better.

## 2011-10-30 LAB — HIV ANTIBODY (ROUTINE TESTING W REFLEX): HIV: NONREACTIVE

## 2011-10-30 LAB — RPR

## 2011-11-04 ENCOUNTER — Encounter: Payer: Self-pay | Admitting: Family Medicine
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
## 2011-12-21 ENCOUNTER — Ambulatory Visit: Payer: Medicaid Other | Admitting: Family Medicine

## 2011-12-22 ENCOUNTER — Ambulatory Visit (INDEPENDENT_AMBULATORY_CARE_PROVIDER_SITE_OTHER): Payer: Medicaid Other | Admitting: Family Medicine

## 2011-12-22 VITALS — BP 122/84 | HR 68 | Temp 97.9°F | Ht 68.0 in | Wt 184.0 lb

## 2011-12-22 DIAGNOSIS — L299 Pruritus, unspecified: Secondary | ICD-10-CM

## 2011-12-22 DIAGNOSIS — L309 Dermatitis, unspecified: Secondary | ICD-10-CM

## 2011-12-22 DIAGNOSIS — L259 Unspecified contact dermatitis, unspecified cause: Secondary | ICD-10-CM

## 2011-12-22 MED ORDER — TRIAMCINOLONE ACETONIDE 0.1 % EX CREA: TOPICAL_CREAM | Freq: Two times a day (BID) | CUTANEOUS | Status: DC

## 2011-12-22 NOTE — Progress Notes (Signed)
S: Pt comes in today for SDA for rash on his legs.  First noticed the rash 2-3 months ago. +puritis.  Nothing changed that prompted him to come in today.  Some discomfort.  No fevers or chills.  Occasional headache. No N/V/D. Patient states that the rash is fine bumps.  Has not changed over the past few days.  Worse at night time.  Has not tried anything for it.     ROS: Per HPI  History  Smoking status  . Never Smoker   Smokeless tobacco  . Not on file    O:  Filed Vitals:   12/22/11 0830  BP: 122/84  Pulse: 68  Temp: 97.9 F (36.6 C)    Gen: NAD Skin: small, raised papules over bilateral medial thighs without overlying erythema, no obvious abscess or infection, no drainage.     A/P: 41 y.o. male p/w skin irritation/contact dermatitis -See problem list -f/u in 3-4 weeks with PCP

## 2011-12-22 NOTE — Assessment & Plan Note (Signed)
Patient's main complaint is itching.  Will try steroid to affected area.  Encouraged moisturizer every day to help with dry skin.  F/u with PCP in a few weeks to see if steroid helped.

## 2011-12-22 NOTE — Patient Instructions (Addendum)
It was nice to meet you today. I think that the bumps and itching on your legs are from dry skin and irritation.  I sent in a prescription for a cream for you to put on the areas that have the bumps twice a day, every day, until the bumps clear up.  Then you can use it as needed. You should also get a good moisturizing lotion and use it every day.   Come back and see Dr. Rivka Safer in a few weeks to if the the new medicine helped.

## 2012-01-26 ENCOUNTER — Other Ambulatory Visit (HOSPITAL_COMMUNITY)
Admission: RE | Admit: 2012-01-26 | Discharge: 2012-01-26 | Disposition: A | Discharge status: Home / Self Care | Payer: Medicaid Other | Source: Ambulatory Visit | Attending: Family Medicine | Admitting: Family Medicine

## 2012-01-26 ENCOUNTER — Encounter: Payer: Self-pay | Admitting: Family Medicine

## 2012-01-26 ENCOUNTER — Ambulatory Visit (INDEPENDENT_AMBULATORY_CARE_PROVIDER_SITE_OTHER): Payer: Medicaid Other | Admitting: Family Medicine

## 2012-01-26 VITALS — BP 122/78 | HR 88 | Temp 98.1°F | Ht 68.0 in | Wt 180.3 lb

## 2012-01-26 DIAGNOSIS — Z113 Encounter for screening for infections with a predominantly sexual mode of transmission: Secondary | ICD-10-CM | POA: Insufficient documentation

## 2012-01-26 DIAGNOSIS — M25559 Pain in unspecified hip: Secondary | ICD-10-CM | POA: Insufficient documentation

## 2012-01-26 DIAGNOSIS — L739 Follicular disorder, unspecified: Secondary | ICD-10-CM

## 2012-01-26 DIAGNOSIS — N411 Chronic prostatitis: Secondary | ICD-10-CM | POA: Insufficient documentation

## 2012-01-26 DIAGNOSIS — R300 Dysuria: Secondary | ICD-10-CM

## 2012-01-26 DIAGNOSIS — L738 Other specified follicular disorders: Secondary | ICD-10-CM

## 2012-01-26 DIAGNOSIS — L293 Anogenital pruritus, unspecified: Secondary | ICD-10-CM

## 2012-01-26 HISTORY — DX: Follicular disorder, unspecified: L73.9

## 2012-01-26 LAB — POCT URINALYSIS DIPSTICK
Bilirubin, UA: NEGATIVE
Blood, UA: NEGATIVE
Glucose, UA: NEGATIVE
Leukocytes, UA: NEGATIVE
Nitrite, UA: NEGATIVE

## 2012-01-26 MED ORDER — SULFAMETHOXAZOLE-TMP DS 800-160 MG PO TABS: 1.0000 | ORAL_TABLET | Freq: Two times a day (BID) | ORAL | Status: DC

## 2012-01-26 MED ORDER — METRONIDAZOLE 1 % EX GEL: Freq: Every day | CUTANEOUS | Status: DC

## 2012-01-26 MED ORDER — DICLOFENAC SODIUM 75 MG PO TBEC: 75.0000 mg | DELAYED_RELEASE_TABLET | Freq: Two times a day (BID) | ORAL | Status: DC

## 2012-01-26 NOTE — Patient Instructions (Addendum)
Meds ordered this encounter  Medications  . sulfamethoxazole-trimethoprim (BACTRIM DS) 800-160 MG per tablet    Sig: Take 1 tablet by mouth 2 (two) times daily.    Dispense:  84 tablet    Refill:  0  . metroNIDAZOLE (METROGEL) 1 % gel    Sig: Apply topically daily.    Dispense:  45 g    Refill:  0  . diclofenac (VOLTAREN) 75 MG EC tablet    Sig: Take 1 tablet (75 mg total) by mouth 2 (two) times daily with a meal.    Dispense:  60 tablet    Refill:  2   These are the medications I prescribed. You will take the antibiotic bactrim for (6 weeks) You will use the metrogel on your legs for the rash. Take the diclofenac twice a day to help with your hip pain.   Prostatitis Prostatitis is an inflammation (the body's way of reacting to injury and/or infection) of the prostate gland. The prostate gland is a male organ. The gland is about the size and shape of a walnut. The prostate is located just below the bladder. It produces semen, which is a fluid that helps nourish and transport sperm. Prostatitis is the most common urinary tract problem in men younger than age 20. There are 4 categories of prostatitis:  I - Acute bacterial prostatitis.   II - Chronic bacterial prostatitis.   III - Chronic prostatitis and chronic pelvic pain syndrome (CPPS).   Inflammatory.   Non inflammatory.   IV - Asymptomatic inflammatory prostatitis.  Acute and chronic bacterial prostatitis are problems with bacterial infections of the prostate. "Acute" infection is usually a one-time problem. "Chronic" bacterial prostatitis is a condition with recurrent infection. It is usually caused by the same germ(bacteria). CPPS has symptoms similar to prostate infection. However, no infection is actually found. This condition can cause problems of ongoing pain. Currently, it cannot be cured. Treatments are available and aimed at symptom control.   Asymptomatic inflammatory prostatitis has no symptoms. It is a condition  where infection-fighting cells are found by chance in the urine. The diagnosis is made most often during an exam for other conditions. Other conditions could be infertility or a high level of PSA (prostate-specific antigen) in the blood. SYMPTOMS   Symptoms can vary depending upon the type of prostatitis that exists. There can also be overlap in symptoms. This can make diagnosis difficult. Symptoms: For Acute bacterial prostatitis  Painful urination.   Fever or chills.   Muscle or joint pains.   Low back pain.   Low abdominal pain.   Inability to empty bladder completely.   Sudden urges to urinate.   Frequent urination during the day.   Difficulty starting urine stream.   Need to urinate several times at night (nocturia).   Weak urine stream.   Urethral (tube that carries urine from the bladder out of the body) discharge and dribbling after urination.  For Chronic bacterial prostatitis  Rectal pain.   Pain in the testicles, penis, or tip of the penis.   Pain in the space between the anus and scrotum (perineum).   Low back pain.   Low abdominal pain.   Problems with sexual function.   Painful ejaculation.   Bloody semen.   Inability to empty bladder completely.   Painful urination.   Sudden urges to urinate.   Frequent urination during the day.   Difficulty starting urine stream.   Need to urinate several times at night (nocturia).  Weak urine stream.   Dribbling after urination.   Urethral discharge.  For Chronic prostatitis and chronic pelvic pain syndrome (CPPS) Symptoms are the same as those for chronic bacterial prostatitis. Problems with sexual function are often the reason for seeking care. This important problem should be discussed with your caregiver. For Asymptomatic inflammatory prostatitis As noted above, there are no symptoms with this condition. DIAGNOSIS    Your caregiver may perform a rectal exam. This exam is to determine if the  prostate is swollen and tender.   Sometimes blood work is performed. This is done to see if your white blood cell count is elevated. The Prostate Specific Antigen (PSA) is also measured. PSA is a blood test that can help detect early prostate cancer.   A urinalysis is done to find out what type of infection is present if this is a suspected cause. An additional urinalysis may be done after a digital rectal exam. This is to see if white blood cells are pushed out of the prostate and into the urine. A low-grade infection of the prostate may not be found on the first urinalysis.  In more difficult cases, your caregiver may advise other tests. Tests could include:  Urodynamics -- Tests the function of the bladder and the organs involved in triggering and controlling normal urination.   Urine flow rate.   Cystoscopy -- In this procedure, a thin, telescope-like tube with a light and tiny camera attached (cystoscope) is inserted into the bladder through the urethra. This allows the caregiver to see the inside of the urethra and bladder.   Electromyography -- This procedure tests how the muscles and nerves of the bladder work. It is focused on the muscles that control the anus and pelvic floor. These are the muscles between the anus and scrotum.  In people who show no signs of infection, certain uncommon infections might be causing constant or recurrent symptoms. These uncommon infections are difficult to detect. More work in medicine may help find solutions to these problems. TREATMENT   Antibiotics are used to treat infections caused by germs. If the infection is not treated and becomes long lasting (chronic), it may become a lower grade infection with minor, continual problems. Without treatment, the prostate may develop a boil or furuncle (abscess). This may require surgical treatment. For those with chronic prostatitis and CPPS, it is important to work closely with your primary caregiver and urologist.  For some, the medicines that are used to treat a non-cancerous, enlarged prostate (benign prostatic hypertrophy) may be helpful. Referrals to specialists other than urologists may be necessary. In rare cases when all treatments have been inadequate for pain control, an operation to remove the prostate may be recommended. This is very rare and before this is considered thorough discussion with your urologist is highly recommended.   In cases of secondary to chronic non-bacterial prostatitis, a good relationship with your urologist or primary caregiver is essential because it is often a recurrent prolonged condition that requires a good understanding of the causes and a commitment to therapy aimed at controlling your symptoms. HOME CARE INSTRUCTIONS    Hot sitz baths for 20 minutes, 4 times per day, may help relieve pain.   Non-prescription pain killers may be used as your caregiver recommends if you have no allergies to them. Some illnesses or conditions prevent use of non-prescription drugs. If unsure, check with your caregiver. Take all medications as directed. Take the antibiotics for the prescribed length of time, even if  you are feeling better.  SEEK MEDICAL CARE IF:    You have any worsening of the symptoms that originally brought you to your caregiver.   You have an oral temperature above 102 F (38.9 C).   You experience any side effects from medications prescribed.  SEEK IMMEDIATE MEDICAL CARE IF:    You have an oral temperature above 102 F (38.9 C), not controlled by medicine.   You have pain not relieved with medications.   You develop nausea, vomiting, lightheadedness, or have a fainting episode.   You are unable to urinate.   You pass bloody urine or clots.  Document Released: 09/18/2000 Document Revised: 09/10/2011 Document Reviewed: 08/24/2011 [facility] Patient Information 2012 [place], [place].

## 2012-01-26 NOTE — Assessment & Plan Note (Signed)
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 Will prescribe Diclofenac 75 mg BID.

## 2012-01-26 NOTE — Progress Notes (Signed)
  Subjective:    Patient ID: Darrell Donovan, male    DOB: Dec 31, 1970, 41 y.o.   MRN: [id number]  HPI 1. Urtheritis Patient has dysuria and hip pain. He was tested for GC/CHl/Syph/HIV last visit in January and his U/A was negative. The bactrim resolved his urethritis, but it returned when he stopped taking it. No HIV done today. He has three negative one each year for the last three years. No sig. Weight loss.  No recent viral illness. No eye pain or visual deficits.   2. Pruritis rash on thighs Tried steroid and moisturizers. No rash anywhere else on body or mucous membranes.   3. Hip Pain Chronic. Worse in am. Aching pain bilaterally. No back pain. No shooting pains. Does not exercise. Is not obese.   Tobacco use: Patient is a non smoker.   Review of Systems Pertinent items are noted in HPI.     Objective:   Physical Exam Filed Vitals:   01/26/12 0914  BP: 122/78  Pulse: 88  Temp: 98.1 F (36.7 C)  TempSrc: Oral  Height: 5\' 8"  (1.727 m)  Weight: 180 lb 4.8 oz (81.784 kg)  weight flow sheet reviewed, normal fluctuation in weight since 2011.  Lungs:  Normal respiratory effort, chest expands symmetrically. Lungs are clear to auscultation, no crackles or wheezes. Eye - Pupils Equal Round Reactive to light, Extraocular movements intact, Fundi without hemorrhage or visible lesions, Conjunctiva without redness or discharge Heart - Regular rate and rhythm.  No murmurs, gallops or rubs.    Abdomen: soft and non-tender without masses, organomegaly or hernias noted.  No guarding or rebound Male genitalia: no penile lesions or discharge no testicular masses no bladder distension noted Prostate examination: exam declined by patient Skin:  Intact without suspicious lesions. Small "heat" bumps on inner thighs, no pustules or ulcers/no bullae or vesicles.  Hips: straight leg negative, pain is on lateral aspect not groin. No TTP. FROM passive and active. No clicking or dislocation       Assessment & Plan:

## 2012-01-26 NOTE — Assessment & Plan Note (Signed)
Patient has a persistent follicular rash between his legs on his thighs. We have tried two different steroids and moisturizer. I will try metrogel and treat this like folliculitis - possibly related to heat and rubbing of thighs.

## 2012-01-26 NOTE — Assessment & Plan Note (Signed)
Patient has dysuria and hip pain. He was tested for GC/CHl/Syph/HIV last visit in January and his U/A was negative. The bactrim resolved his urethritis, but it returned when he stopped taking it. 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  No HIV done today. He has three negative one each year for the last three years. No sig. Weight loss.  I checked a U/A, urine culture, Gc/Chl.

## 2012-01-28 LAB — URINE CULTURE: Organism ID, Bacteria: NO GROWTH

## 2012-03-02 ENCOUNTER — Encounter: Payer: Self-pay | Admitting: Family Medicine

## 2012-03-02 ENCOUNTER — Ambulatory Visit (INDEPENDENT_AMBULATORY_CARE_PROVIDER_SITE_OTHER): Payer: Medicaid Other | Admitting: Family Medicine

## 2012-03-02 VITALS — BP 118/76 | HR 59 | Temp 98.5°F | Ht 68.0 in | Wt 177.6 lb

## 2012-03-02 DIAGNOSIS — B353 Tinea pedis: Secondary | ICD-10-CM

## 2012-03-02 DIAGNOSIS — L299 Pruritus, unspecified: Secondary | ICD-10-CM

## 2012-03-02 MED ORDER — TRIAMCINOLONE ACETONIDE 0.1 % EX CREA: TOPICAL_CREAM | Freq: Two times a day (BID) | CUTANEOUS | Status: DC

## 2012-03-02 MED ORDER — TERBINAFINE HCL 1 % EX CREA: TOPICAL_CREAM | Freq: Two times a day (BID) | CUTANEOUS | Status: DC

## 2012-03-02 NOTE — Assessment & Plan Note (Signed)
Both feet between multiple toes.  1 month history.

## 2012-03-02 NOTE — Patient Instructions (Signed)
It was great to see you today!  Schedule an appointment to see me as needed.  I have prescribed you two creams for your feet and for your legs.

## 2012-03-02 NOTE — Progress Notes (Signed)
  Subjective:   Patient ID: Darrell Donovan, male DOB: [birth year] 41 y.o. MRN: [id number] HPI:  1. Chronic rash of inner thighs with pruritis Synopsis: patient has been seen by me and other providers for his inner thigh rash multiple times. He has a very faint red papular rash on the inner thighs that was treated with steroids, antibiotics. It appears almost as a heat rash. RPR/HIV negative.  Location: Inner thighs Onset: has been chronic and recurrent  Time period of: months Severity is described as mild.  Aggravating: unknown Alleviating: kenalog cream apparently is helping.  Associated sx/sn: no fever, no joint pain. He has 3 lbs weight loss that is unintentional (but his graph since 2011 is stable)  2. Tinea Pedis Synopsis: patient has 1 month history of pruritis and scaling red rash between multiple toes.  Location: web space of multiple toes bilaterally.  Onset: has been acute  Time period of: 1 month(s).  Severity is described as mild-moderate.  Aggravating: long periods of wearing the same socks and shoes Alleviating: taking shoes and socks off, cleaning toes well in shower.  Associated sx/sn: itching of the feet.   History  Substance Use Topics  . Smoking status: Never Smoker   . Smokeless tobacco: Not on file  . Alcohol Use: No    Review of Systems: Pertinent items are noted in HPI.  Labs Reviewed: yes, HIV RPR neg.  Reviewed Chart Review for last notes.     Objective:   Filed Vitals:   03/02/12 0935  BP: 118/76  Pulse: 59  Temp: 98.5 F (36.9 C)  TempSrc: Oral  Height: 5\' 8"  (1.727 m)  Weight: 177 lb 9.6 oz (80.559 kg)   Physical Exam: General: aam, nad, pleasant Thighs: normal appearing with very fine red papular rash. No dry skin. No excoriations.  Feet: scaling red rash between multiple toes.  Assessment & Plan:

## 2012-03-02 NOTE — Assessment & Plan Note (Signed)
On his thighs. Improved with kenalog cream.

## 2012-04-12 ENCOUNTER — Ambulatory Visit: Payer: Medicaid Other | Admitting: Family Medicine

## 2012-04-15 ENCOUNTER — Ambulatory Visit (INDEPENDENT_AMBULATORY_CARE_PROVIDER_SITE_OTHER): Payer: Medicaid Other | Admitting: Family Medicine

## 2012-04-15 ENCOUNTER — Other Ambulatory Visit (HOSPITAL_COMMUNITY)
Admission: RE | Admit: 2012-04-15 | Discharge: 2012-04-15 | Disposition: A | Discharge status: Home / Self Care | Payer: Medicaid Other | Source: Ambulatory Visit | Attending: Family Medicine | Admitting: Family Medicine

## 2012-04-15 ENCOUNTER — Encounter: Payer: Self-pay | Admitting: Family Medicine

## 2012-04-15 VITALS — BP 119/75 | HR 64 | Temp 98.5°F | Ht 68.0 in | Wt 169.9 lb

## 2012-04-15 DIAGNOSIS — Z202 Contact with and (suspected) exposure to infections with a predominantly sexual mode of transmission: Secondary | ICD-10-CM

## 2012-04-15 DIAGNOSIS — L853 Xerosis cutis: Secondary | ICD-10-CM | POA: Insufficient documentation

## 2012-04-15 DIAGNOSIS — R238 Other skin changes: Secondary | ICD-10-CM

## 2012-04-15 DIAGNOSIS — Z113 Encounter for screening for infections with a predominantly sexual mode of transmission: Secondary | ICD-10-CM | POA: Insufficient documentation

## 2012-04-15 DIAGNOSIS — Z2089 Contact with and (suspected) exposure to other communicable diseases: Secondary | ICD-10-CM

## 2012-04-15 NOTE — Patient Instructions (Addendum)
Eucerin or Aquafor cream Apply to arms and legs 2 x per day.  Return in 1 month for recheck.

## 2012-04-15 NOTE — Assessment & Plan Note (Signed)
Reassured pt that i feel that dry skin, even though it is bothersome and itchy, can be improved with aggressive moisturization.  Recommended aqua for or Eucerin bid. Reassured pt that std screening was negative in Jan 2013.   Agreed to retest for STD's again at today's appointment to reassure patient.

## 2012-04-15 NOTE — Progress Notes (Signed)
  Subjective:    Patient ID: Darrell Donovan, male    DOB: May 07, 1971, 41 y.o.   MRN: [id number]  HPI Rash: Patient reports rash times months. Improved when he was seen in May and given steroid cream. Also treated for athlete's foot that time and this has improved. But continues to have itching and dryness of arms and legs. No longer has any of the cream that was prescribed. Patient states he feels this rash is due to a sexually transmitted infection. Patient states he no longer has a sexual partner but during the past 6 months he did have relations unprotected with a patient who thinks may have had syphilis.  Had STD screening that was done in January. These were all normal. Negative for syphilis. Like be rechecked at this time. No drainage. No redness of skin. No swelling in skin. No fever.  Smoking status reviewed.   Review of Systems As per above.    Objective:   Physical Exam  Constitutional: He appears well-developed and well-nourished.  Cardiovascular: Normal rate, regular rhythm and normal heart sounds.   No murmur heard. Pulmonary/Chest: Effort normal. No respiratory distress. He has no wheezes.  Neurological: He is alert.  Skin: Skin is warm and dry.       Dry skin present on arms and legs.  No rash present.  No rash on palms or soles of feet.  No lesions on torso or back.           Assessment & Plan:

## 2012-04-16 LAB — RPR

## 2012-04-18 ENCOUNTER — Encounter: Payer: Self-pay | Admitting: Family Medicine

## 2012-05-12 ENCOUNTER — Ambulatory Visit (INDEPENDENT_AMBULATORY_CARE_PROVIDER_SITE_OTHER): Payer: Self-pay | Admitting: Family Medicine

## 2012-05-12 ENCOUNTER — Encounter: Payer: Self-pay | Admitting: Family Medicine

## 2012-05-12 VITALS — BP 110/70 | HR 60 | Temp 99.1°F | Ht 68.0 in | Wt 166.6 lb

## 2012-05-12 DIAGNOSIS — L309 Dermatitis, unspecified: Secondary | ICD-10-CM | POA: Insufficient documentation

## 2012-05-12 DIAGNOSIS — L259 Unspecified contact dermatitis, unspecified cause: Secondary | ICD-10-CM

## 2012-05-12 MED ORDER — TRIAMCINOLONE ACETONIDE 0.1 % EX CREA: TOPICAL_CREAM | Freq: Two times a day (BID) | CUTANEOUS | Status: DC

## 2012-05-12 NOTE — Assessment & Plan Note (Signed)
Will refill triamcinolone.  Reassured not a sign of STD.  Has not had sex since last check 3 weeks ago.  Discussed signs of STD for return.  Declined to fill oral antibiotic today.

## 2012-05-12 NOTE — Patient Instructions (Addendum)
No signs of infection   Will refill cream for rash  Your tests for HIV, syphilis, gonorrhea, chlamydia were normal 3 weeks ago

## 2012-05-12 NOTE — Progress Notes (Signed)
  Subjective:    Patient ID: Darrell Donovan, male    DOB: July 08, 1971, 41 y.o.   MRN: [id number]  HPI Rash in thighs  Today requests refill of cream and pill for infection.  States has a thigh infection.  Notes an intermittent itchy rash on thighs for months. States resolves with using triamcinolone cream for 1-2 weeks.  No fevers, chills, no new soaps detregents, not heat induced.  Patient seen 9 times in the past year for combination of rash, STD testing, penile complaints.   After discussing exam and refilling triamcinolone, he requests more "pills" because he feels he has an infection.  He states his wife had an STD and he would ilke treatment.  He states he has not been sexually active because they are separated.  Has HIV, RPR, GC/Chlamydia normal 3 weeks ago. Denies penile lesions, penile discharge.  Review of Systems See HPI    Objective:   Physical Exam GEN: NAD Ski:  Dry skin.  Examined inner thighs.  A few small skin colored papules appearance of normal follicles.  No evidence of infection such as mollsucm, warts, folliclulitis.       Assessment & Plan:

## 2012-06-03 ENCOUNTER — Ambulatory Visit (INDEPENDENT_AMBULATORY_CARE_PROVIDER_SITE_OTHER): Payer: Self-pay | Admitting: Family Medicine

## 2012-06-03 ENCOUNTER — Encounter: Payer: Self-pay | Admitting: Family Medicine

## 2012-06-03 VITALS — BP 102/72 | Temp 98.4°F | Ht 68.0 in | Wt 166.0 lb

## 2012-06-03 DIAGNOSIS — L259 Unspecified contact dermatitis, unspecified cause: Secondary | ICD-10-CM

## 2012-06-03 DIAGNOSIS — R634 Abnormal weight loss: Secondary | ICD-10-CM | POA: Insufficient documentation

## 2012-06-03 DIAGNOSIS — L309 Dermatitis, unspecified: Secondary | ICD-10-CM

## 2012-06-03 LAB — CBC WITH DIFFERENTIAL/PLATELET
Basophils Relative: 0 % (ref 0–1)
Eosinophils Absolute: 0 10*3/uL (ref 0.0–0.7)
HCT: 42.2 % (ref 39.0–52.0)
Hemoglobin: 14.2 g/dL (ref 13.0–17.0)
MCH: 28.9 pg (ref 26.0–34.0)
MCHC: 33.6 g/dL (ref 30.0–36.0)
MCV: 85.8 fL (ref 78.0–100.0)
Monocytes Absolute: 0.6 10*3/uL (ref 0.1–1.0)
Monocytes Relative: 10 % (ref 3–12)
Neutro Abs: 3.1 10*3/uL (ref 1.7–7.7)

## 2012-06-03 NOTE — Assessment & Plan Note (Addendum)
Advised him to start cetirizine, empiric treatment.  Will check LFT's, CR, bilirubin

## 2012-06-03 NOTE — Patient Instructions (Addendum)
Start taking Men's One a day vitamin  Take cetirizine- allergy medication 10 mg one tablet daily  Make follow-up appointment for one week to discuss lab results

## 2012-06-03 NOTE — Progress Notes (Signed)
  Subjective:    Patient ID: Darrell Donovan, male    DOB: Jan 22, 1971, 41 y.o.   MRN: [id number]  HPI  Here to discuss body itching,intermitten urethral itching.  Has had multiple.  visits in the past year for the same with negative HIV, RPR, CG.CHlamydia.  Also noted weight loss.  This is supported by EMR, 20 pund weight loss in the past 6-9 months. Wt Readings from Last 3 Encounters:  06/03/12 166 lb (75.297 kg)  05/12/12 166 lb 9.6 oz (75.569 kg)  04/15/12 169 lb 14.4 oz (77.066 kg)   Describes intermittent urethral itching.  Rash on legs and skin which was not present on exam at last visit.  States he would like treatment for an infection.  He denies penile discharge, urinary frequency or urgency, and test have been negative.  He cannot further explain what kind of infection he is concerned he has.  He does not recall being on oral antihistamines (w as prescribed hydroxy cine in the past).  States past treatment of antibiotics have helped intermittently but returned.  Reports feeling tired when awakening, anhedonia.  Denies SI, Sadness.  States only anxiety is concern for illness.   I have reviewed patient's  PMH, FH, and Social history and Medications as related to this visit.  Review of Systemsno fver, chills, night sweats, cough,     Objective:   Physical Exam  GEN: Alert & Oriented, No acute distress CV:  Regular Rate & Rhythm, no murmur Respiratory:  Normal work of breathing, CTAB Abd:  + BS, soft, no tenderness to palpation Ext: no pre-tibial edema Psych: flat affect, poor eye contact.  Slow speech.  Depressed appearing     Assessment & Plan:

## 2012-06-03 NOTE — Assessment & Plan Note (Signed)
Nonspecific symtpoms with unintentional weight loss.  Will check cbd c diff, hiv, cmet, tsh, esr, and hepatitis b and c.  Suspect component of anxiety/depression may also be playing a role- he denis. Will have him return in 1 week to discuss labwork.   Advised a daily multivitamin

## 2012-06-04 LAB — HEPATITIS C ANTIBODY, REFLEX: HCV Ab: NEGATIVE

## 2012-06-04 LAB — HIV ANTIBODY (ROUTINE TESTING W REFLEX): HIV: NONREACTIVE

## 2012-06-04 LAB — TSH: TSH: 1.207 u[IU]/mL (ref 0.350–4.500)

## 2012-06-07 ENCOUNTER — Encounter: Payer: Self-pay | Admitting: Family Medicine

## 2012-08-22 ENCOUNTER — Emergency Department (HOSPITAL_COMMUNITY): Payer: No Typology Code available for payment source

## 2012-08-22 ENCOUNTER — Encounter (HOSPITAL_COMMUNITY): Payer: Self-pay | Admitting: Emergency Medicine

## 2012-08-22 ENCOUNTER — Emergency Department (HOSPITAL_COMMUNITY)
Admission: EM | Admit: 2012-08-22 | Discharge: 2012-08-22 | Disposition: A | Discharge status: Home / Self Care | Payer: No Typology Code available for payment source | Attending: Emergency Medicine | Admitting: Emergency Medicine

## 2012-08-22 DIAGNOSIS — Z872 Personal history of diseases of the skin and subcutaneous tissue: Secondary | ICD-10-CM | POA: Insufficient documentation

## 2012-08-22 DIAGNOSIS — S3981XA Other specified injuries of abdomen, initial encounter: Secondary | ICD-10-CM | POA: Insufficient documentation

## 2012-08-22 DIAGNOSIS — IMO0002 Reserved for concepts with insufficient information to code with codable children: Secondary | ICD-10-CM | POA: Insufficient documentation

## 2012-08-22 DIAGNOSIS — S298XXA Other specified injuries of thorax, initial encounter: Secondary | ICD-10-CM | POA: Insufficient documentation

## 2012-08-22 DIAGNOSIS — Y939 Activity, unspecified: Secondary | ICD-10-CM | POA: Insufficient documentation

## 2012-08-22 DIAGNOSIS — T148XXA Other injury of unspecified body region, initial encounter: Secondary | ICD-10-CM

## 2012-08-22 DIAGNOSIS — Y929 Unspecified place or not applicable: Secondary | ICD-10-CM | POA: Insufficient documentation

## 2012-08-22 MED ORDER — HYDROCODONE-ACETAMINOPHEN 5-500 MG PO TABS: 1.0000 | ORAL_TABLET | Freq: Four times a day (QID) | ORAL | Status: AC | PRN

## 2012-08-22 MED ORDER — CYCLOBENZAPRINE HCL 10 MG PO TABS: 10.0000 mg | ORAL_TABLET | Freq: Two times a day (BID) | ORAL | Status: AC | PRN

## 2012-08-22 MED ORDER — ONDANSETRON HCL 4 MG/2ML IJ SOLN
4.0000 mg | Freq: Once | INTRAMUSCULAR | Status: AC
  Administered 2012-08-22: 4 mg via INTRAVENOUS
  Filled 2012-08-22: qty 2

## 2012-08-22 MED ORDER — FENTANYL CITRATE 0.05 MG/ML IJ SOLN
50.0000 ug | Freq: Once | INTRAMUSCULAR | Status: AC
  Administered 2012-08-22: 50 ug via INTRAVENOUS
  Filled 2012-08-22: qty 2

## 2012-08-22 MED ORDER — IOHEXOL 300 MG/ML  SOLN
100.0000 mL | Freq: Once | INTRAMUSCULAR | Status: AC | PRN
  Administered 2012-08-22: 100 mL via INTRAVENOUS
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
## 2012-08-22 NOTE — ED Notes (Signed)
Patient transported to CT 
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
## 2013-03-03 ENCOUNTER — Other Ambulatory Visit (HOSPITAL_COMMUNITY)
Admission: RE | Admit: 2013-03-03 | Discharge: 2013-03-03 | Disposition: A | Discharge status: Home / Self Care | Payer: No Typology Code available for payment source | Source: Ambulatory Visit | Attending: Family Medicine | Admitting: Family Medicine

## 2013-03-03 ENCOUNTER — Ambulatory Visit (INDEPENDENT_AMBULATORY_CARE_PROVIDER_SITE_OTHER): Payer: No Typology Code available for payment source | Admitting: Family Medicine

## 2013-03-03 ENCOUNTER — Encounter: Payer: Self-pay | Admitting: Family Medicine

## 2013-03-03 VITALS — BP 102/65 | HR 67 | Temp 98.8°F | Ht 68.0 in | Wt 192.0 lb

## 2013-03-03 DIAGNOSIS — Z202 Contact with and (suspected) exposure to infections with a predominantly sexual mode of transmission: Secondary | ICD-10-CM | POA: Insufficient documentation

## 2013-03-03 DIAGNOSIS — Z113 Encounter for screening for infections with a predominantly sexual mode of transmission: Secondary | ICD-10-CM | POA: Insufficient documentation

## 2013-03-03 DIAGNOSIS — Z7251 High risk heterosexual behavior: Secondary | ICD-10-CM

## 2013-03-03 DIAGNOSIS — R399 Unspecified symptoms and signs involving the genitourinary system: Secondary | ICD-10-CM

## 2013-03-03 DIAGNOSIS — Z2089 Contact with and (suspected) exposure to other communicable diseases: Secondary | ICD-10-CM

## 2013-03-03 DIAGNOSIS — L309 Dermatitis, unspecified: Secondary | ICD-10-CM

## 2013-03-03 DIAGNOSIS — L259 Unspecified contact dermatitis, unspecified cause: Secondary | ICD-10-CM

## 2013-03-03 DIAGNOSIS — Z20828 Contact with and (suspected) exposure to other viral communicable diseases: Secondary | ICD-10-CM

## 2013-03-03 DIAGNOSIS — R3989 Other symptoms and signs involving the genitourinary system: Secondary | ICD-10-CM

## 2013-03-03 LAB — COMPREHENSIVE METABOLIC PANEL
ALT: 17 U/L (ref 0–53)
AST: 20 U/L (ref 0–37)
CO2: 27 mEq/L (ref 19–32)
Calcium: 9.6 mg/dL (ref 8.4–10.5)
Chloride: 105 mEq/L (ref 96–112)
Creat: 1.27 mg/dL (ref 0.50–1.35)
Sodium: 141 mEq/L (ref 135–145)
Total Protein: 6.8 g/dL (ref 6.0–8.3)

## 2013-03-03 LAB — POCT URINALYSIS DIPSTICK
Bilirubin, UA: NEGATIVE
Glucose, UA: NEGATIVE
Ketones, UA: NEGATIVE
Leukocytes, UA: NEGATIVE
Nitrite, UA: NEGATIVE

## 2013-03-03 LAB — CBC
MCV: 85.1 fL (ref 78.0–100.0)
Platelets: 252 10*3/uL (ref 150–400)
RBC: 4.76 MIL/uL (ref 4.22–5.81)
RDW: 14.3 % (ref 11.5–15.5)
WBC: 5.8 10*3/uL (ref 4.0–10.5)

## 2013-03-03 LAB — VITAMIN B12: Vitamin B-12: 458 pg/mL (ref 211–911)

## 2013-03-03 MED ORDER — PHENAZOPYRIDINE HCL 100 MG PO TABS: 100.0000 mg | ORAL_TABLET | Freq: Three times a day (TID) | ORAL | Status: AC | PRN

## 2013-03-03 NOTE — Patient Instructions (Addendum)
I will let you know in the next 24-48 hours if you have an infection and will need antibiotics Please start using hydrocortisone cream on your skin in the areas where it itches.  Have a great weekend.

## 2013-03-03 NOTE — Progress Notes (Signed)
Darrell Donovan is a 42 y.o. male who presents to [facility] today for burning w/ urination  Urethral itching: started 2 wks ago. No alleviateing factors. Itching sensation piror to and after urination. Comes and goes. Denies any penile discharge. Last intercourse 3 wks ago w/o condom for protection. Partner w/ h/o STD. Denies fevers n/v/d/c, sweats.   Foot pain: started 2 wks ago. Denies trauma. Sensation of something moving in foot 3-4 times daily. Comes on after intercourse. Had last year and resolved after ABX. 2012 was first time pt had this problem. Associated w/ "blood" spots on sole of foot. Pt from [place], and last time there in 1996.   The following portions of the patient's history were reviewed and updated as appropriate: allergies, current medications, past medical history, family and social history, and problem list.  Patient is a nonsmoker.   Past Medical History  Diagnosis Date  . Folliculitis 01/26/2012    ROS as above otherwise neg.    Medications reviewed. Current Outpatient Prescriptions  Medication Sig Dispense Refill  . cyclobenzaprine (FLEXERIL) 10 MG tablet Take 1 tablet (10 mg total) by mouth 2 (two) times daily as needed for muscle spasms.  20 tablet  0  . HYDROcodone-acetaminophen (VICODIN) 5-500 MG per tablet Take 1-2 tablets by mouth every 6 (six) hours as needed for pain.  15 tablet  0  . phenazopyridine (PYRIDIUM) 100 MG tablet Take 1 tablet (100 mg total) by mouth 3 (three) times daily as needed for pain.  30 tablet  0   No current facility-administered medications for this visit.    Exam:  BP 102/65  Pulse 67  Temp(Src) 98.8 F (37.1 C) (Oral)  Ht 5\' 8"  (1.727 m)  Wt 192 lb (87.091 kg)  BMI 29.2 kg/m2 Gen: Well NAD HEENT: EOMI,  MMM SKin: multiple hyperpigmented 1cm spots on foot that are non-blanching. No trauma present or erythema/induration. No obvious rash  Results for orders placed in visit on 03/03/13 (from the past 72 hour(s))  POCT URINALYSIS  DIPSTICK     Status: None   Collection Time    03/03/13  3:24 PM      Result Value Range   Color, UA yellow     Clarity, UA clear     Glucose, UA negative     Bilirubin, UA negative     Ketones, UA negative     Spec Grav, UA >=1.030     Blood, UA negative     pH, UA 6.0     Protein, UA negative     Urobilinogen, UA 0.2     Nitrite, UA negative     Leukocytes, UA Negative    HIV ANTIBODY (ROUTINE TESTING)     Status: None   Collection Time    03/03/13  4:12 PM      Result Value Range   HIV NON REACTIVE  NON REACTIVE  RPR     Status: None   Collection Time    03/03/13  4:12 PM      Result Value Range   RPR NON REAC  NON REAC  CBC     Status: None   Collection Time    03/03/13  4:12 PM      Result Value Range   WBC 5.8  4.0 - 10.5 K/uL   RBC 4.76  4.22 - 5.81 MIL/uL   Hemoglobin 14.0  13.0 - 17.0 g/dL   HCT 96.0  45.4 - 09.8 %   MCV 85.1  78.0 -  100.0 fL   MCH 29.4  26.0 - 34.0 pg   MCHC 34.6  30.0 - 36.0 g/dL   RDW 16.1  09.6 - 04.5 %   Platelets 252  150 - 400 K/uL  COMPREHENSIVE METABOLIC PANEL     Status: None   Collection Time    03/03/13  4:12 PM      Result Value Range   Sodium 141  135 - 145 mEq/L   Potassium 4.0  3.5 - 5.3 mEq/L   Chloride 105  96 - 112 mEq/L   CO2 27  19 - 32 mEq/L   Glucose, Bld 91  70 - 99 mg/dL   BUN 21  6 - 23 mg/dL   Creat 4.09  8.11 - 9.14 mg/dL   Total Bilirubin 0.3  0.3 - 1.2 mg/dL   Alkaline Phosphatase 64  39 - 117 U/L   AST 20  0 - 37 U/L   ALT 17  0 - 53 U/L   Total Protein 6.8  6.0 - 8.3 g/dL   Albumin 4.2  3.5 - 5.2 g/dL   Calcium 9.6  8.4 - 78.2 mg/dL  VITAMIN N56     Status: None   Collection Time    03/03/13  4:12 PM      Result Value Range   Vitamin B-12 458  211 - 911 pg/mL  FOLATE     Status: None   Collection Time    03/03/13  4:12 PM      Result Value Range   Folate >20.0     Comment:       Reference Ranges             Deficient:       0.4 - 3.3 ng/mL             Indeterminate:   3.4 - 5.4 ng/mL              Normal:              > 5.4 ng/mL        TSH     Status: None   Collection Time    03/03/13  4:12 PM      Result Value Range   TSH 1.581  0.350 - 4.500 uIU/mL

## 2013-03-04 LAB — HIV ANTIBODY (ROUTINE TESTING W REFLEX): HIV: NONREACTIVE

## 2013-03-04 LAB — RPR

## 2013-03-04 NOTE — Assessment & Plan Note (Addendum)
Numerous complaints in the past for skin related irritation all associated w/ STD exposure.  Hydrocortisone cream PRN.  RPR negative so foot presentation unlikely syphillus.  Likely benign. No recent travel outside the [place] so unlikely to be parasitic infection as has been here for nearly 20 years.  Foot appearance nml.  TSH, Folate, Vit B12 per preceptors (Dr. Lum Babe) recommendation as pt w/ vague sensation in feet

## 2013-03-04 NOTE — Assessment & Plan Note (Addendum)
UA, HIV, RPR, neg thus far, GC Chl pending Pt aware of risks associated w/ unprotected intercourse.  High concern for GC chl infection Will f/u w/ labs as they become available All previous STD checks negative since 2011  ADDENDUM. All infectious results negative. WIll have staff call pt to make aware as was unable to get through at time of my call.

## 2013-03-07 ENCOUNTER — Encounter: Payer: Self-pay | Admitting: *Deleted

## 2013-03-07 ENCOUNTER — Telehealth: Payer: Self-pay | Admitting: *Deleted

## 2013-03-07 NOTE — Telephone Encounter (Signed)
Message copied by Osborne Oman on Tue Mar 07, 2013  1:46 PM ------      Message from: [facility], DAVID J      Created: Tue Mar 07, 2013 11:32 AM       Please call pt w/ results.       Everything came back negative.       Cont pyridium       Practice safe sex (condoms) to prevent infection ------

## 2013-03-07 NOTE — Telephone Encounter (Signed)
Number has been dc'd.  Will mail letter. Christo Hain, Maryjo [place]

## 2016-10-27 ENCOUNTER — Encounter (HOSPITAL_COMMUNITY): Payer: Self-pay | Admitting: Emergency Medicine

## 2016-10-27 ENCOUNTER — Emergency Department (HOSPITAL_COMMUNITY)
Admission: EM | Admit: 2016-10-27 | Discharge: 2016-10-27 | Disposition: A | Discharge status: Home / Self Care | Payer: BLUE CROSS/BLUE SHIELD | Attending: Emergency Medicine | Admitting: Emergency Medicine

## 2016-10-27 DIAGNOSIS — R51 Headache: Secondary | ICD-10-CM | POA: Insufficient documentation

## 2016-10-27 DIAGNOSIS — Z5321 Procedure and treatment not carried out due to patient leaving prior to being seen by health care provider: Secondary | ICD-10-CM | POA: Insufficient documentation

## 2016-10-27 LAB — CBC WITH DIFFERENTIAL/PLATELET
BASOS ABS: 0 10*3/uL (ref 0.0–0.1)
BASOS PCT: 0 %
EOS ABS: 0.1 10*3/uL (ref 0.0–0.7)
Eosinophils Relative: 1 %
HCT: 42.6 % (ref 39.0–52.0)
HEMOGLOBIN: 14.5 g/dL (ref 13.0–17.0)
Lymphocytes Relative: 35 %
Lymphs Abs: 2.6 10*3/uL (ref 0.7–4.0)
MCH: 29.2 pg (ref 26.0–34.0)
MCHC: 34 g/dL (ref 30.0–36.0)
MCV: 85.7 fL (ref 78.0–100.0)
Monocytes Absolute: 0.8 10*3/uL (ref 0.1–1.0)
Monocytes Relative: 11 %
NEUTROS PCT: 53 %
Neutro Abs: 4 10*3/uL (ref 1.7–7.7)
Platelets: 246 10*3/uL (ref 150–400)
RBC: 4.97 MIL/uL (ref 4.22–5.81)
RDW: 13.1 % (ref 11.5–15.5)
WBC: 7.5 10*3/uL (ref 4.0–10.5)

## 2016-10-27 LAB — COMPREHENSIVE METABOLIC PANEL
ALK PHOS: 58 U/L (ref 38–126)
ALT: 15 U/L — AB (ref 17–63)
ANION GAP: 11 (ref 5–15)
AST: 19 U/L (ref 15–41)
Albumin: 4.2 g/dL (ref 3.5–5.0)
BUN: 13 mg/dL (ref 6–20)
CALCIUM: 9.7 mg/dL (ref 8.9–10.3)
CO2: 27 mmol/L (ref 22–32)
Chloride: 100 mmol/L — ABNORMAL LOW (ref 101–111)
Creatinine, Ser: 1.21 mg/dL (ref 0.61–1.24)
GFR calc Af Amer: >60 mL/min (ref >60)
GFR calc non Af Amer: >60 mL/min (ref >60)
GLUCOSE: 79 mg/dL (ref 65–99)
Potassium: 4.2 mmol/L (ref 3.5–5.1)
SODIUM: 138 mmol/L (ref 135–145)
Total Bilirubin: 0.2 mg/dL — ABNORMAL LOW (ref 0.3–1.2)
Total Protein: 6.9 g/dL (ref 6.5–8.1)
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
## 2016-10-27 NOTE — ED Notes (Signed)
..  Patient came to nurse first, does not want to wait any longer.  Encouraged patient to stay, patient declined.  This nurse advised patient to return to ED if symptoms persist or worsens.  Patient verbalized understanding.
